# Patient Record
Sex: Female | Born: 2000 | Race: White | Hispanic: No | Marital: Single
Health system: Southern US, Community
[De-identification: ages and names within clinical notes are randomized; demographics above are authoritative.]

## PROBLEM LIST (undated history)

## (undated) DIAGNOSIS — K529 Noninfective gastroenteritis and colitis, unspecified: Secondary | ICD-10-CM

## (undated) DIAGNOSIS — K5900 Constipation, unspecified: Secondary | ICD-10-CM

## (undated) HISTORY — DX: Noninfective gastroenteritis and colitis, unspecified: K52.9

## (undated) HISTORY — DX: Constipation, unspecified: K59.00

---
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
## 2022-11-14 ENCOUNTER — Emergency Department: Payer: MEDICAID

## 2022-11-14 ENCOUNTER — Other Ambulatory Visit: Payer: Self-pay

## 2022-11-14 DIAGNOSIS — R42 Dizziness and giddiness: Secondary | ICD-10-CM | POA: Insufficient documentation

## 2022-11-14 DIAGNOSIS — R0602 Shortness of breath: Secondary | ICD-10-CM | POA: Diagnosis present

## 2022-11-14 DIAGNOSIS — F419 Anxiety disorder, unspecified: Secondary | ICD-10-CM | POA: Insufficient documentation

## 2022-11-14 LAB — COMPREHENSIVE METABOLIC PANEL
ALT: 17 U/L (ref 0–44)
AST: 17 U/L (ref 15–41)
Albumin: 4.3 g/dL (ref 3.5–5.0)
Alkaline Phosphatase: 58 U/L (ref 38–126)
Anion gap: 13 (ref 5–15)
BUN: 10 mg/dL (ref 6–20)
CO2: 23 mmol/L (ref 22–32)
Calcium: 9.7 mg/dL (ref 8.9–10.3)
Chloride: 102 mmol/L (ref 98–111)
Creatinine, Ser: 0.69 mg/dL (ref 0.44–1.00)
GFR, Estimated: >60 mL/min (ref >60)
Glucose, Bld: 91 mg/dL (ref 70–99)
Potassium: 3.8 mmol/L (ref 3.5–5.1)
Sodium: 138 mmol/L (ref 135–145)
Total Bilirubin: 0.5 mg/dL (ref 0.3–1.2)
Total Protein: 7.6 g/dL (ref 6.5–8.1)

## 2022-11-14 LAB — CBC WITH DIFFERENTIAL/PLATELET
Abs Immature Granulocytes: 0.02 10*3/uL (ref 0.00–0.07)
Basophils Absolute: 0.1 10*3/uL (ref 0.0–0.1)
Basophils Relative: 1 %
Eosinophils Absolute: 0.1 10*3/uL (ref 0.0–0.5)
Eosinophils Relative: 1 %
HCT: 43.2 % (ref 36.0–46.0)
Hemoglobin: 14.7 g/dL (ref 12.0–15.0)
Immature Granulocytes: 0 %
Lymphocytes Relative: 46 %
Lymphs Abs: 5 10*3/uL — ABNORMAL HIGH (ref 0.7–4.0)
MCH: 31.3 pg (ref 26.0–34.0)
MCHC: 34 g/dL (ref 30.0–36.0)
MCV: 91.9 fL (ref 80.0–100.0)
Monocytes Absolute: 0.9 10*3/uL (ref 0.1–1.0)
Monocytes Relative: 8 %
Neutro Abs: 4.8 10*3/uL (ref 1.7–7.7)
Neutrophils Relative %: 44 %
Platelets: 381 10*3/uL (ref 150–400)
RBC: 4.7 MIL/uL (ref 3.87–5.11)
RDW: 11.8 % (ref 11.5–15.5)
WBC: 11 10*3/uL — ABNORMAL HIGH (ref 4.0–10.5)
nRBC: 0 % (ref 0.0–0.2)

## 2022-11-14 LAB — GROUP A STREP BY PCR: Group A Strep by PCR: NOT DETECTED

## 2022-11-14 MED ORDER — ALBUTEROL SULFATE HFA 108 (90 BASE) MCG/ACT IN AERS
2.0000 | INHALATION_SPRAY | RESPIRATORY_TRACT | Status: DC | PRN
  Filled 2022-11-14: qty 6.7

## 2022-11-14 NOTE — ED Triage Notes (Signed)
Pt reports feeling SOB with sensation of throat closing since 1700. Pt denies known allergens. No hives or itching noted. Reports associated dizziness and mild chest pain. Hx of anxiety. Pt reports sore throat today as well. No swelling noted to uvula or tongue and pt speaking with ease. Maintaining all secretions with ease. Possible mild inflammation to L tonsil noted. Pt ambulatory to triage. Alert and oriented. Breathing unlabored.

## 2022-11-15 ENCOUNTER — Emergency Department
Admission: EM | Admit: 2022-11-15 | Discharge: 2022-11-15 | Disposition: A | Discharge status: Home / Self Care | Payer: MEDICAID | Attending: Emergency Medicine | Admitting: Emergency Medicine

## 2022-11-15 DIAGNOSIS — R42 Dizziness and giddiness: Secondary | ICD-10-CM

## 2022-11-15 DIAGNOSIS — F419 Anxiety disorder, unspecified: Secondary | ICD-10-CM

## 2022-11-15 LAB — TSH: TSH: 2.072 u[IU]/mL (ref 0.350–4.500)

## 2022-11-15 LAB — T4, FREE: Free T4: 0.96 ng/dL (ref 0.61–1.12)

## 2022-11-15 MED ORDER — LORAZEPAM 2 MG/ML IJ SOLN
0.5000 mg | Freq: Once | INTRAMUSCULAR | Status: AC
  Administered 2022-11-15: 0.5 mg via INTRAVENOUS
  Filled 2022-11-15: qty 1

## 2022-11-15 MED ORDER — SODIUM CHLORIDE 0.9 % IV BOLUS
1000.0000 mL | Freq: Once | INTRAVENOUS | Status: AC
  Administered 2022-11-15: 1000 mL via INTRAVENOUS
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
## 2022-11-15 NOTE — Discharge Instructions (Signed)
Drink plenty of fluids daily.  Return to the ER for worsening symptoms, persistent vomiting, difficulty breathing or other concerns. °

## 2023-01-14 ENCOUNTER — Other Ambulatory Visit: Payer: Self-pay | Admitting: Family Medicine

## 2023-01-14 DIAGNOSIS — R1084 Generalized abdominal pain: Secondary | ICD-10-CM

## 2023-01-16 ENCOUNTER — Ambulatory Visit
Admission: RE | Admit: 2023-01-16 | Discharge: 2023-01-16 | Disposition: A | Discharge status: Home / Self Care | Payer: MEDICAID | Source: Ambulatory Visit | Attending: Family Medicine | Admitting: Family Medicine

## 2023-01-16 DIAGNOSIS — R1084 Generalized abdominal pain: Secondary | ICD-10-CM | POA: Insufficient documentation

## 2023-09-30 ENCOUNTER — Ambulatory Visit: Payer: MEDICAID | Admitting: Licensed Clinical Social Worker

## 2023-10-14 ENCOUNTER — Ambulatory Visit: Payer: MEDICAID | Admitting: Licensed Clinical Social Worker

## 2024-03-10 ENCOUNTER — Ambulatory Visit: Payer: MEDICAID | Admitting: Licensed Clinical Social Worker
